# Patient Record
Sex: Female | Born: 1990 | Hispanic: Yes | Marital: Married | Smoking: Never smoker
Health system: Southern US, Community
[De-identification: ages and names within clinical notes are randomized; demographics above are authoritative.]

## PROBLEM LIST (undated history)

## (undated) DIAGNOSIS — K429 Umbilical hernia without obstruction or gangrene: Secondary | ICD-10-CM

---

## 2013-07-28 ENCOUNTER — Emergency Department (HOSPITAL_BASED_OUTPATIENT_CLINIC_OR_DEPARTMENT_OTHER)
Admission: EM | Admit: 2013-07-28 | Discharge: 2013-07-28 | Disposition: A | Discharge status: Home / Self Care | Payer: Self-pay | Attending: Emergency Medicine | Admitting: Emergency Medicine

## 2013-07-28 ENCOUNTER — Encounter (HOSPITAL_BASED_OUTPATIENT_CLINIC_OR_DEPARTMENT_OTHER): Payer: Self-pay | Admitting: Emergency Medicine

## 2013-07-28 DIAGNOSIS — Z3202 Encounter for pregnancy test, result negative: Secondary | ICD-10-CM | POA: Insufficient documentation

## 2013-07-28 DIAGNOSIS — Z79899 Other long term (current) drug therapy: Secondary | ICD-10-CM | POA: Insufficient documentation

## 2013-07-28 DIAGNOSIS — N390 Urinary tract infection, site not specified: Secondary | ICD-10-CM | POA: Insufficient documentation

## 2013-07-28 LAB — URINALYSIS, ROUTINE W REFLEX MICROSCOPIC
Bilirubin Urine: NEGATIVE
Glucose, UA: NEGATIVE mg/dL
Hgb urine dipstick: NEGATIVE
Ketones, ur: NEGATIVE mg/dL
Nitrite: NEGATIVE
Protein, ur: NEGATIVE mg/dL
Specific Gravity, Urine: 1.021 (ref 1.005–1.030)
Urobilinogen, UA: 0.2 mg/dL (ref 0.0–1.0)
pH: 7 (ref 5.0–8.0)

## 2013-07-28 LAB — URINE MICROSCOPIC-ADD ON

## 2013-07-28 LAB — PREGNANCY, URINE: Preg Test, Ur: NEGATIVE

## 2013-07-28 MED ORDER — PHENAZOPYRIDINE HCL 200 MG PO TABS: 200.0000 mg | ORAL_TABLET | Freq: Three times a day (TID) | ORAL | Status: DC

## 2013-07-28 MED ORDER — NITROFURANTOIN MONOHYD MACRO 100 MG PO CAPS: 100.0000 mg | ORAL_CAPSULE | Freq: Two times a day (BID) | ORAL | Status: DC

## 2013-07-28 MED ORDER — NITROFURANTOIN MONOHYD MACRO 100 MG PO CAPS
100.0000 mg | ORAL_CAPSULE | Freq: Once | ORAL | Status: AC
  Administered 2013-07-28: 100 mg via ORAL
  Filled 2013-07-28: qty 1

## 2013-07-28 NOTE — ED Provider Notes (Signed)
CSN: [id number]     Arrival date & time 07/28/13  0134 History   None    Chief Complaint  Patient presents with  . Dysuria     (Consider location/radiation/quality/duration/timing/severity/associated sxs/prior Treatment) Patient is a 23 y.o. female presenting with dysuria. The history is provided by the patient. No language interpreter was used.  Dysuria Pain quality:  Aching Pain severity:  Moderate Onset quality:  Gradual Duration:  5 days Timing:  Constant Progression:  Unchanged Chronicity:  Recurrent Recent urinary tract infections: yes   Relieved by:  Nothing Worsened by:  Nothing tried Ineffective treatments:  None tried Urinary symptoms: no discolored urine and no hematuria   Associated symptoms: no abdominal pain and no flank pain   Risk factors: no hx of pyelonephritis     History reviewed. No pertinent past medical history. History reviewed. No pertinent past surgical history. History reviewed. No pertinent family history. History  Substance Use Topics  . Smoking status: Never Smoker   . Smokeless tobacco: Not on file  . Alcohol Use: Not on file   OB History   Grav Para Term Preterm Abortions TAB SAB Ect Mult Living                 Review of Systems  Gastrointestinal: Negative for abdominal pain.  Genitourinary: Positive for dysuria. Negative for flank pain.  All other systems reviewed and are negative.     Allergies  Review of patient's allergies indicates no known allergies.  Home Medications   Current Outpatient Rx  Name  Route  Sig  Dispense  Refill  . nitrofurantoin, macrocrystal-monohydrate, (MACROBID) 100 MG capsule   Oral   Take 1 capsule (100 mg total) by mouth 2 (two) times daily. X 7 days   14 capsule   0   . phenazopyridine (PYRIDIUM) 200 MG tablet   Oral   Take 1 tablet (200 mg total) by mouth 3 (three) times daily.   6 tablet   0    BP 128/83  Pulse 88  Temp(Src) 98.2 F (36.8 C) (Oral)  Resp 18  Ht 5' (1.524 m)  Wt  155 lb (70.308 kg)  BMI 30.27 kg/m2  SpO2 99%  LMP 07/19/2013 Physical Exam  Constitutional: She is oriented to person, place, and time. She appears well-developed and well-nourished. No distress.  HENT:  Head: Normocephalic and atraumatic.  Mouth/Throat: Oropharynx is clear and moist.  Eyes: Conjunctivae are normal. Pupils are equal, round, and reactive to light.  Neck: Normal range of motion. Neck supple.  Cardiovascular: Normal rate, regular rhythm and intact distal pulses.   Pulmonary/Chest: Effort normal and breath sounds normal. She has no wheezes. She has no rales.  Abdominal: Soft. Bowel sounds are normal. There is no tenderness. There is no rebound and no guarding.  Musculoskeletal: Normal range of motion.  Neurological: She is alert and oriented to person, place, and time.  Skin: Skin is warm and dry.  Psychiatric: She has a normal mood and affect.    ED Course  Procedures (including critical care time) Labs Review Labs Reviewed  URINALYSIS, ROUTINE W REFLEX MICROSCOPIC - Abnormal; Notable for the following:    APPearance CLOUDY (*)    Leukocytes, UA MODERATE (*)    All other components within normal limits  URINE MICROSCOPIC-ADD ON - Abnormal; Notable for the following:    Squamous Epithelial / LPF MANY (*)    Bacteria, UA FEW (*)    All other components within normal limits  PREGNANCY, URINE  Imaging Review No results found.   EKG Interpretation None      MDM   Final diagnoses:  UTI (lower urinary tract infection)   UTI will treat with macrobid and pyridium, follow up with your PMD in 7 days for a recheck    Tyshell Ramberg K Lowana Hable-Rasch, MD 07/28/13 [phone]

## 2013-07-28 NOTE — ED Notes (Signed)
Pt reports burining with urination x 1 week

## 2016-10-26 ENCOUNTER — Encounter (HOSPITAL_BASED_OUTPATIENT_CLINIC_OR_DEPARTMENT_OTHER): Payer: Self-pay | Admitting: *Deleted

## 2016-10-26 ENCOUNTER — Emergency Department (HOSPITAL_BASED_OUTPATIENT_CLINIC_OR_DEPARTMENT_OTHER)
Admission: EM | Admit: 2016-10-26 | Discharge: 2016-10-26 | Disposition: A | Discharge status: Home / Self Care | Payer: Self-pay | Attending: Emergency Medicine | Admitting: Emergency Medicine

## 2016-10-26 ENCOUNTER — Emergency Department (HOSPITAL_BASED_OUTPATIENT_CLINIC_OR_DEPARTMENT_OTHER): Payer: Self-pay

## 2016-10-26 DIAGNOSIS — R0789 Other chest pain: Secondary | ICD-10-CM

## 2016-10-26 DIAGNOSIS — R079 Chest pain, unspecified: Secondary | ICD-10-CM | POA: Insufficient documentation

## 2016-10-26 LAB — PREGNANCY, URINE: Preg Test, Ur: NEGATIVE

## 2016-10-26 MED ORDER — IBUPROFEN 800 MG PO TABS
800.0000 mg | ORAL_TABLET | Freq: Once | ORAL | Status: AC
  Administered 2016-10-26: 800 mg via ORAL
  Filled 2016-10-26: qty 1

## 2016-10-26 MED ORDER — IBUPROFEN 800 MG PO TABS: 800.0000 mg | ORAL_TABLET | Freq: Three times a day (TID) | ORAL | 0 refills | Status: DC | PRN

## 2016-10-26 NOTE — Discharge Instructions (Signed)
You may alternate Tylenol 1000 mg every 6 hours as needed for pain and Ibuprofen 800 mg every 8 hours as needed for pain.  Please take Ibuprofen with food. ° ° ° °To find a primary care or specialty doctor please call [phone] or [phone] to access "[facility] Find a Doctor Service." ° °You may also go on the [facility] website at [email or web address] ° °There are also multiple [facility], [facility], [facility] and [facility] practices throughout the [place] that are frequently accepting new patients. You may find a clinic that is close to your home and contact them. ° °[facility] -  °[address] °[place] [address] °[phone] ° ° °[facility] -  °[address] °[place] [address] [phone] ° ° °[facility] - °[address]  °[place] [address] °[phone] ° ° °

## 2016-10-26 NOTE — ED Triage Notes (Addendum)
Pt c/o left sided chest pain and SOB and h/a   x 2 weeks off and on

## 2016-10-26 NOTE — ED Notes (Signed)
ED Provider at bedside. 

## 2016-10-26 NOTE — ED Notes (Signed)
Patient transported to X-ray 

## 2016-10-26 NOTE — ED Provider Notes (Signed)
TIME SEEN: 2:15 AM  CHIEF COMPLAINT: Chest pain  HPI: Patient is a 26 year old female with no significant past medical history who presents to the emergency department with 2 weeks of intermittent left chest pain. She describes the pain as sharp in nature and only last for a split second and then resolves. No aggravating or alleviating factors. No radiation of pain. She states sometimes it makes her feel like she can't catch her breath. No nausea, vomiting, fevers, cough, lower extremity swelling or pain. She denies history of PE, DVT. No recent prolonged immobilization such as long flight, hospitalization, fracture, surgery, trauma. She does not smoke. She is on birth control pills. No family history of sudden cardiac death or premature CAD. She has not tried any medications prior to arrival. No chest pain or shortness of breath currently.  ROS: See HPI Constitutional: no fever  Eyes: no drainage  ENT: no runny nose   Cardiovascular:   chest pain  Resp:  SOB  GI: no vomiting GU: no dysuria Integumentary: no rash  Allergy: no hives  Musculoskeletal: no leg swelling  Neurological: no slurred speech ROS otherwise negative  PAST MEDICAL HISTORY/PAST SURGICAL HISTORY:  History reviewed. No pertinent past medical history.  MEDICATIONS:  Prior to Admission medications   Medication Sig Start Date End Date Taking? Authorizing Provider  nitrofurantoin, macrocrystal-monohydrate, (MACROBID) 100 MG capsule Take 1 capsule (100 mg total) by mouth 2 (two) times daily. X 7 days 07/28/13   Palumbo, April, MD  phenazopyridine (PYRIDIUM) 200 MG tablet Take 1 tablet (200 mg total) by mouth 3 (three) times daily. 07/28/13   Palumbo, April, MD    ALLERGIES:  No Known Allergies  SOCIAL HISTORY:  Social History  Substance Use Topics  . Smoking status: Never Smoker  . Smokeless tobacco: Not on file  . Alcohol use No    FAMILY HISTORY: No family history on file.  EXAM: BP 137/85   Pulse 72   Temp  98.2 F (36.8 C)   Resp 18   Ht 5\' 1"  (1.549 m)   Wt 77.1 kg (170 lb)   LMP 10/25/2016   SpO2 100%   BMI 32.12 kg/m  CONSTITUTIONAL: Alert and oriented and responds appropriately to questions. Well-appearing; well-nourished HEAD: Normocephalic EYES: Conjunctivae clear, pupils appear equal, EOMI ENT: normal nose; moist mucous membranes NECK: Supple, no meningismus, no nuchal rigidity, no LAD  CARD: RRR; S1 and S2 appreciated; no murmurs, no clicks, no rubs, no gallops CHEST:  Chest wall is tender to palpation over the left anterior chest wall which reproduces her pain.  No crepitus, ecchymosis, erythema, warmth, rash or other lesions present.   RESP: Normal chest excursion without splinting or tachypnea; breath sounds clear and equal bilaterally; no wheezes, no rhonchi, no rales, no hypoxia or respiratory distress, speaking full sentences ABD/GI: Normal bowel sounds; non-distended; soft, non-tender, no rebound, no guarding, no peritoneal signs, no hepatosplenomegaly BACK:  The back appears normal and is non-tender to palpation, there is no CVA tenderness EXT: Normal ROM in all joints; non-tender to palpation; no edema; normal capillary refill; no cyanosis, no calf tenderness or swelling    SKIN: Normal color for age and race; warm; no rash NEURO: Moves all extremities equally PSYCH: The patient's mood and manner are appropriate. Grooming and personal hygiene are appropriate.  MEDICAL DECISION MAKING: Patient here with very atypical chest pain. It is reproducible with palpation and only last for 1-2 seconds and then resolves. She is not having any symptoms currently. EKG  shows no ischemic abnormality. Chest x-ray is also clear. No edema, pneumonia, pneumothorax, rib fracture. Pregnancy test negative. I do not think this is a pulmonary embolus. She has no significant risk factors and is not tachycardic, hypotensive or hypoxic. I do not think this is ACS. I do not think this is a dissection. I  feel she is safe for discharge home and follow-up with an outpatient provider as needed. Recommended alternating Tylenol and Motrin. Given dose of ibuprofen prior to discharge. Patient comfortable with this plan.  At this time, I do not feel there is any life-threatening condition present. I have reviewed and discussed all results (EKG, imaging, lab, urine as appropriate) and exam findings with patient/family. I have reviewed nursing notes and appropriate previous records.  I feel the patient is safe to be discharged home without further emergent workup and can continue workup as an outpatient as needed. Discussed usual and customary return precautions. Patient/family verbalize understanding and are comfortable with this plan.  Outpatient follow-up has been provided if needed. All questions have been answered.      EKG Interpretation  Date/Time:  Tuesday October 26 2016 01:14:51 EDT Ventricular Rate:  77 PR Interval:    QRS Duration: 100 QT Interval:  391 QTC Calculation: 443 R Axis:   71 Text Interpretation:  Sinus rhythm Borderline Q waves in inferior leads No significant change was found Confirmed by Rochele RaringWard, Danay Mckellar [phone]([id number]) on 10/26/2016 1:17:58 AM         Marua Qin, Layla MawKristen N, DO 10/26/16 0340

## 2018-03-30 ENCOUNTER — Emergency Department (HOSPITAL_BASED_OUTPATIENT_CLINIC_OR_DEPARTMENT_OTHER)
Admission: EM | Admit: 2018-03-30 | Discharge: 2018-03-31 | Disposition: A | Discharge status: Home / Self Care | Payer: Self-pay | Attending: Emergency Medicine | Admitting: Emergency Medicine

## 2018-03-30 ENCOUNTER — Encounter (HOSPITAL_BASED_OUTPATIENT_CLINIC_OR_DEPARTMENT_OTHER): Payer: Self-pay | Admitting: *Deleted

## 2018-03-30 ENCOUNTER — Other Ambulatory Visit: Payer: Self-pay

## 2018-03-30 DIAGNOSIS — R0789 Other chest pain: Secondary | ICD-10-CM

## 2018-03-30 DIAGNOSIS — R002 Palpitations: Secondary | ICD-10-CM

## 2018-03-30 NOTE — ED Triage Notes (Signed)
Chest pain x 2 days. Throbbing pain in the center of her chest. States sometimes she feels something jump in her chest causing her to be loose her breath for a second.

## 2018-03-30 NOTE — ED Provider Notes (Signed)
[facility] Provider Note: Jennifer DellJ. Lane Maisa Bedingfield, MD, FACEP  CSN: [id number] MRN: [id number] ARRIVAL: 03/30/18 at 2120 ROOM: MH01/[facility]01   CHIEF COMPLAINT  Chest Pain   HISTORY OF PRESENT ILLNESS  03/30/18 11:39 PM Jennifer Gallagher is a 27 y.o. female with no significant past medical history.  She has been having chest pain since this afternoon's (nursing notes indicate 2 days but the patient insists to me that it began this afternoon).  The pain is located to the left of her upper sternum.  It is dull and moderate in severity.  It happens for 1 or 2 minutes at a time.  Nothing brings the symptoms on.  She feels short of breath during these episodes but not nauseated or diaphoretic.  Pain is not worse with movement or palpation or deep breathing.  She is also been having palpitations which she describes as "spasms" that last 1 to 2 seconds and seem to take her breath away.  She has had these before.    History reviewed. No pertinent past medical history.  History reviewed. No pertinent surgical history.  No family history on file.  Social History   Tobacco Use  . Smoking status: Never Smoker  . Smokeless tobacco: Never Used  Substance Use Topics  . Alcohol use: No  . Drug use: No    Prior to Admission medications   Medication Sig Start Date End Date Taking? Authorizing Provider  ibuprofen (ADVIL,MOTRIN) 800 MG tablet Take 1 tablet (800 mg total) by mouth every 8 (eight) hours as needed for mild pain. 10/26/16   Ward, Layla MawKristen N, DO  nitrofurantoin, macrocrystal-monohydrate, (MACROBID) 100 MG capsule Take 1 capsule (100 mg total) by mouth 2 (two) times daily. X 7 days 07/28/13   Palumbo, April, MD  phenazopyridine (PYRIDIUM) 200 MG tablet Take 1 tablet (200 mg total) by mouth 3 (three) times daily. 07/28/13   Palumbo, April, MD    Allergies Patient has no known allergies.   REVIEW OF SYSTEMS  Negative except as noted here or in the History of Present  Illness.   PHYSICAL EXAMINATION  Initial Vital Signs Blood pressure 128/83, pulse 66, temperature 98.8 F (37.1 C), temperature source Oral, resp. rate 18, height 5\' 1"  (1.549 m), weight 74.8 kg, SpO2 99 %.  Examination General: Well-developed, well-nourished female in no acute distress; appearance consistent with age of record HENT: normocephalic; atraumatic Eyes: pupils equal, round and reactive to light; extraocular muscles intact Neck: supple Heart: regular rate and rhythm; no murmur Lungs: clear to auscultation bilaterally Chest: Nontender Abdomen: soft; nondistended; nontender; bowel sounds present Extremities: No deformity; full range of motion; pulses normal Neurologic: Awake, alert and oriented; motor function intact in all extremities and symmetric; no facial droop Skin: Warm and dry Psychiatric: Normal mood and affect   RESULTS  Summary of this visit's results, reviewed by myself:   EKG Interpretation  Date/Time:  Thursday March 30 2018 21:26:22 EST Ventricular Rate:  76 PR Interval:  148 QRS Duration: 94 QT Interval:  404 QTC Calculation: 454 R Axis:   72 Text Interpretation:  Normal sinus rhythm Normal ECG No significant change since last tracing Confirmed by Frederick PeersLittle, Rachel [phone]([id number]) on 03/30/2018 9:44:22 PM      Laboratory Studies: Results for orders placed or performed during the hospital encounter of 03/30/18 (from the past 24 hour(s))  CBC with Differential/Platelet     Status: None   Collection Time: 03/31/18 12:10 AM  Result Value Ref Range   WBC 6.9 4.0 -  10.5 K/uL   RBC 4.18 3.87 - 5.11 MIL/uL   Hemoglobin 12.6 12.0 - 15.0 g/dL   HCT 16.1 09.6 - 04.5 %   MCV 92.6 80.0 - 100.0 fL   MCH 30.1 26.0 - 34.0 pg   MCHC 32.6 30.0 - 36.0 g/dL   RDW 40.9 81.1 - 91.4 %   Platelets 284 150 - 400 K/uL   nRBC 0.0 0.0 - 0.2 %   Neutrophils Relative % 58 %   Neutro Abs 4.0 1.7 - 7.7 K/uL   Lymphocytes Relative 32 %   Lymphs Abs 2.2 0.7 - 4.0 K/uL    Monocytes Relative 8 %   Monocytes Absolute 0.6 0.1 - 1.0 K/uL   Eosinophils Relative 2 %   Eosinophils Absolute 0.1 0.0 - 0.5 K/uL   Basophils Relative 0 %   Basophils Absolute 0.0 0.0 - 0.1 K/uL   Immature Granulocytes 0 %   Abs Immature Granulocytes 0.01 0.00 - 0.07 K/uL  Basic metabolic panel     Status: Abnormal   Collection Time: 03/31/18 12:10 AM  Result Value Ref Range   Sodium 139 135 - 145 mmol/L   Potassium 3.2 (L) 3.5 - 5.1 mmol/L   Chloride 107 98 - 111 mmol/L   CO2 24 22 - 32 mmol/L   Glucose, Bld 98 70 - 99 mg/dL   BUN 7 6 - 20 mg/dL   Creatinine, Ser 7.82 0.44 - 1.00 mg/dL   Calcium 8.7 (L) 8.9 - 10.3 mg/dL   GFR calc non Af Amer >60 >60 mL/min   GFR calc Af Amer >60 >60 mL/min   Anion gap 8 5 - 15  Troponin I - ONCE - STAT     Status: None   Collection Time: 03/31/18 12:10 AM  Result Value Ref Range   Troponin I <0.03 <0.03 ng/mL  Pregnancy, urine     Status: None   Collection Time: 03/31/18 12:16 AM  Result Value Ref Range   Preg Test, Ur NEGATIVE NEGATIVE   Imaging Studies: No results found.  ED COURSE and MDM  Nursing notes and initial vitals signs, including pulse oximetry, reviewed.  Vitals:   03/30/18 2128 03/30/18 2322 03/31/18 0000 03/31/18 0100  BP: 128/83 116/73 105/73 115/74  Pulse: 66 76 75 74  Resp: 18 17 15 19   Temp: 98.8 F (37.1 C)     TempSrc: Oral     SpO2: 99% 99% 100% 100%  Weight:      Height:       1:28 AM Patient feeling better.  She has had no "spasms" while in the ED.  Her monitor strip has been free of PVCs or other arrhythmia.  I suspect these "spasms" represent PVCs given the way she describes them.  She has a primary care physician with whom she can follow-up.  PROCEDURES    ED DIAGNOSES     ICD-10-CM   1. Atypical chest pain R07.89   2. Palpitations R00.2        Tressy Kunzman, Jonny Ruiz, MD 03/31/18 [phone]

## 2018-03-31 LAB — CBC WITH DIFFERENTIAL/PLATELET
ABS IMMATURE GRANULOCYTES: 0.01 10*3/uL (ref 0.00–0.07)
BASOS PCT: 0 %
Basophils Absolute: 0 10*3/uL (ref 0.0–0.1)
Eosinophils Absolute: 0.1 10*3/uL (ref 0.0–0.5)
Eosinophils Relative: 2 %
HEMATOCRIT: 38.7 % (ref 36.0–46.0)
HEMOGLOBIN: 12.6 g/dL (ref 12.0–15.0)
IMMATURE GRANULOCYTES: 0 %
Lymphocytes Relative: 32 %
Lymphs Abs: 2.2 10*3/uL (ref 0.7–4.0)
MCH: 30.1 pg (ref 26.0–34.0)
MCHC: 32.6 g/dL (ref 30.0–36.0)
MCV: 92.6 fL (ref 80.0–100.0)
MONO ABS: 0.6 10*3/uL (ref 0.1–1.0)
MONOS PCT: 8 %
NEUTROS ABS: 4 10*3/uL (ref 1.7–7.7)
NEUTROS PCT: 58 %
PLATELETS: 284 10*3/uL (ref 150–400)
RBC: 4.18 MIL/uL (ref 3.87–5.11)
RDW: 12.5 % (ref 11.5–15.5)
WBC: 6.9 10*3/uL (ref 4.0–10.5)
nRBC: 0 % (ref 0.0–0.2)

## 2018-03-31 LAB — BASIC METABOLIC PANEL
ANION GAP: 8 (ref 5–15)
BUN: 7 mg/dL (ref 6–20)
CHLORIDE: 107 mmol/L (ref 98–111)
CO2: 24 mmol/L (ref 22–32)
Calcium: 8.7 mg/dL — ABNORMAL LOW (ref 8.9–10.3)
Creatinine, Ser: 0.53 mg/dL (ref 0.44–1.00)
GFR calc Af Amer: >60 mL/min (ref >60)
GFR calc non Af Amer: >60 mL/min (ref >60)
GLUCOSE: 98 mg/dL (ref 70–99)
Potassium: 3.2 mmol/L — ABNORMAL LOW (ref 3.5–5.1)
Sodium: 139 mmol/L (ref 135–145)

## 2018-03-31 LAB — PREGNANCY, URINE: Preg Test, Ur: NEGATIVE

## 2018-03-31 LAB — TROPONIN I: Troponin I: <0.03 ng/mL (ref <0.03)

## 2018-06-30 ENCOUNTER — Emergency Department (HOSPITAL_BASED_OUTPATIENT_CLINIC_OR_DEPARTMENT_OTHER): Payer: Self-pay

## 2018-06-30 ENCOUNTER — Emergency Department (HOSPITAL_BASED_OUTPATIENT_CLINIC_OR_DEPARTMENT_OTHER)
Admission: EM | Admit: 2018-06-30 | Discharge: 2018-06-30 | Disposition: A | Discharge status: Home / Self Care | Payer: Self-pay | Attending: Emergency Medicine | Admitting: Emergency Medicine

## 2018-06-30 ENCOUNTER — Encounter (HOSPITAL_BASED_OUTPATIENT_CLINIC_OR_DEPARTMENT_OTHER): Payer: Self-pay | Admitting: Adult Health

## 2018-06-30 ENCOUNTER — Other Ambulatory Visit: Payer: Self-pay

## 2018-06-30 DIAGNOSIS — W010XXA Fall on same level from slipping, tripping and stumbling without subsequent striking against object, initial encounter: Secondary | ICD-10-CM | POA: Insufficient documentation

## 2018-06-30 DIAGNOSIS — Y92019 Unspecified place in single-family (private) house as the place of occurrence of the external cause: Secondary | ICD-10-CM | POA: Insufficient documentation

## 2018-06-30 DIAGNOSIS — S5000XA Contusion of unspecified elbow, initial encounter: Secondary | ICD-10-CM

## 2018-06-30 DIAGNOSIS — Y998 Other external cause status: Secondary | ICD-10-CM | POA: Insufficient documentation

## 2018-06-30 DIAGNOSIS — Y93E5 Activity, floor mopping and cleaning: Secondary | ICD-10-CM | POA: Insufficient documentation

## 2018-06-30 DIAGNOSIS — S5001XA Contusion of right elbow, initial encounter: Secondary | ICD-10-CM | POA: Insufficient documentation

## 2018-06-30 MED ORDER — ONDANSETRON HCL 4 MG/2ML IJ SOLN
4.0000 mg | Freq: Once | INTRAMUSCULAR | Status: AC
  Administered 2018-06-30: 4 mg via INTRAVENOUS
  Filled 2018-06-30: qty 2

## 2018-06-30 MED ORDER — MORPHINE SULFATE (PF) 4 MG/ML IV SOLN
4.0000 mg | Freq: Once | INTRAVENOUS | Status: AC
Start: 2018-06-30 — End: 2018-06-30
  Administered 2018-06-30: 4 mg via INTRAVENOUS
  Filled 2018-06-30: qty 1

## 2018-06-30 NOTE — Discharge Instructions (Signed)
Wear the sling as needed for comfort.  Do not do any heavy lifting with the right arm for the next few days until starting to feel better.  Take Tylenol and ibuprofen as needed for the pain.

## 2018-06-30 NOTE — ED Provider Notes (Signed)
[facility] EMERGENCY DEPARTMENT Provider Note   CSN: [id number] Arrival date & time: 06/30/18  8295    History   Chief Complaint Chief Complaint  Patient presents with  . Elbow Injury    HPI Jennifer Gallagher is a 28 y.o. female.     Patient is a 28 year old healthy female presenting today after falling and injuring her right elbow.  She states she was mopping the floor and she slipped and fell landing directly on her elbow.  Since that time she has not been able to straighten her arm and has 10 out of 10 sharp shooting pain in her right elbow.  She states her hand feels little funny but she can sense everything.  It is very painful for her to do hand grip and she states she is unable to straighten her arm.  She has no wrist pain or shoulder pain.  She did not hit her head or lose consciousness.  She denies any injury in her legs.  The history is provided by the patient.    History reviewed. No pertinent past medical history.  There are no active problems to display for this patient.   History reviewed. No pertinent surgical history.   OB History   No obstetric history on file.      Home Medications    Prior to Admission medications   Not on File    Family History History reviewed. No pertinent family history.  Social History Social History   Tobacco Use  . Smoking status: Never Smoker  . Smokeless tobacco: Never Used  Substance Use Topics  . Alcohol use: No  . Drug use: No     Allergies   Patient has no known allergies.   Review of Systems Review of Systems  All other systems reviewed and are negative.    Physical Exam Updated Vital Signs BP 114/82 (BP Location: Left Arm)   Pulse 74   Temp 98.2 F (36.8 C) (Oral)   Resp 18   Ht 5\' 1"  (1.549 m)   Wt 74.8 kg   SpO2 98%   BMI 31.18 kg/m   Physical Exam Vitals signs and nursing note reviewed.  Constitutional:      General: She is not in acute distress.    Appearance: She  is well-developed.  HENT:     Head: Normocephalic and atraumatic.  Eyes:     Pupils: Pupils are equal, round, and reactive to light.  Cardiovascular:     Rate and Rhythm: Normal rate and regular rhythm.     Heart sounds: Normal heart sounds. No murmur. No friction rub.  Pulmonary:     Effort: Pulmonary effort is normal.     Breath sounds: Normal breath sounds. No wheezing or rales.  Abdominal:     General: Bowel sounds are normal. There is no distension.     Palpations: Abdomen is soft.     Tenderness: There is no abdominal tenderness. There is no guarding or rebound.  Musculoskeletal:        General: Tenderness, deformity and signs of injury present.     Right shoulder: Normal.     Right elbow: She exhibits decreased range of motion, swelling and deformity. Tenderness found. Medial epicondyle, lateral epicondyle and olecranon process tenderness noted.     Right wrist: Normal.     Comments: Sensation to the right hand intact and hand grip 4/5.  2+ radial pulse in the right wrist  Skin:    General: Skin is warm  and dry.     Capillary Refill: Capillary refill takes less than 2 seconds.     Findings: No rash.  Neurological:     Mental Status: She is alert and oriented to person, place, and time.     Cranial Nerves: No cranial nerve deficit.  Psychiatric:        Behavior: Behavior normal.      ED Treatments / Results  Labs (all labs ordered are listed, but only abnormal results are displayed) Labs Reviewed - No data to display  EKG None  Radiology Dg Elbow Complete Right  Result Date: 06/30/2018 CLINICAL DATA:  Posterior right elbow pain and swelling due to an injury suffered in a fall today. Initial encounter. EXAM: RIGHT ELBOW - COMPLETE 3+ VIEW COMPARISON:  None. FINDINGS: Soft tissue swelling is seen over the olecranon most consistent with hemorrhage into the olecranon bursa given the patient's history. No fracture or dislocation. No elbow joint effusion. IMPRESSION:  Findings most consistent with hemorrhagic olecranon bursitis. No bony abnormality. Electronically Signed   By: Drusilla Kanner M.D.   On: 06/30/2018 11:10    Procedures Procedures (including critical care time)  Medications Ordered in ED Medications  ondansetron (ZOFRAN) injection 4 mg (4 mg Intravenous Given 06/30/18 1018)  morphine 4 MG/ML injection 4 mg (4 mg Intravenous Given 06/30/18 1018)     Initial Impression / Assessment and Plan / ED Course  I have reviewed the triage vital signs and the nursing notes.  Pertinent labs & imaging results that were available during my care of the patient were reviewed by me and considered in my medical decision making (see chart for details).       Healthy young female presenting today with elbow deformity and pain after falling.  Concern for fracture versus dislocation.  Neurovascularly intact.  Patient given pain medication and x-rays pending.  12:17 PM X-ray is negative for fracture or dislocation.  It does show a hemorrhagic olecranon bursitis which is from the fall and where she has the majority of her swelling and ecchymosis.  Patient was placed in a sling for comfort and will use ibuprofen and Tylenol as needed.  Given follow-up as needed.  Final Clinical Impressions(s) / ED Diagnoses   Final diagnoses:  Traumatic hematoma of elbow, initial encounter    ED Discharge Orders    None       Gwyneth Sprout, MD 06/30/18 1218

## 2018-06-30 NOTE — ED Notes (Signed)
Patient transported to X-ray 

## 2018-06-30 NOTE — ED Triage Notes (Signed)
Present with right elbow deformity after slippong and falling on water that was on the floor. CMS intact.

## 2018-06-30 NOTE — ED Notes (Signed)
Slipped on water after mopping floor  Pain and deformity to rt elbow   Pos radial pulse

## 2018-09-21 ENCOUNTER — Ambulatory Visit (HOSPITAL_BASED_OUTPATIENT_CLINIC_OR_DEPARTMENT_OTHER)
Admission: RE | Admit: 2018-09-21 | Discharge: 2018-09-21 | Disposition: A | Discharge status: Home / Self Care | Payer: Self-pay | Source: Ambulatory Visit | Attending: Emergency Medicine | Admitting: Emergency Medicine

## 2018-09-21 ENCOUNTER — Encounter (HOSPITAL_BASED_OUTPATIENT_CLINIC_OR_DEPARTMENT_OTHER): Payer: Self-pay

## 2018-09-21 ENCOUNTER — Emergency Department (HOSPITAL_BASED_OUTPATIENT_CLINIC_OR_DEPARTMENT_OTHER)
Admission: EM | Admit: 2018-09-21 | Discharge: 2018-09-21 | Disposition: A | Discharge status: Home / Self Care | Payer: Self-pay | Attending: Emergency Medicine | Admitting: Emergency Medicine

## 2018-09-21 ENCOUNTER — Other Ambulatory Visit: Payer: Self-pay

## 2018-09-21 ENCOUNTER — Encounter (HOSPITAL_BASED_OUTPATIENT_CLINIC_OR_DEPARTMENT_OTHER): Payer: Self-pay | Admitting: Emergency Medicine

## 2018-09-21 DIAGNOSIS — Y762 Prosthetic and other implants, materials and accessory obstetric and gynecological devices associated with adverse incidents: Secondary | ICD-10-CM | POA: Insufficient documentation

## 2018-09-21 DIAGNOSIS — Z975 Presence of (intrauterine) contraceptive device: Secondary | ICD-10-CM | POA: Insufficient documentation

## 2018-09-21 DIAGNOSIS — T839XXA Unspecified complication of genitourinary prosthetic device, implant and graft, initial encounter: Secondary | ICD-10-CM | POA: Insufficient documentation

## 2018-09-21 LAB — PREGNANCY, URINE: Preg Test, Ur: NEGATIVE

## 2018-09-21 LAB — WET PREP, GENITAL
Clue Cells Wet Prep HPF POC: NONE SEEN
Sperm: NONE SEEN
Trich, Wet Prep: NONE SEEN
Yeast Wet Prep HPF POC: NONE SEEN

## 2018-09-21 MED ORDER — KETOROLAC TROMETHAMINE 30 MG/ML IJ SOLN
30.0000 mg | Freq: Once | INTRAMUSCULAR | Status: AC
  Administered 2018-09-21: 30 mg via INTRAMUSCULAR
  Filled 2018-09-21: qty 1

## 2018-09-21 NOTE — ED Provider Notes (Signed)
[facility] Provider Note: Lowella Dell, MD, FACEP  CSN: [id number] MRN: [id number] ARRIVAL: 09/21/18 at 0111 ROOM: [facility]03/[facility]03   CHIEF COMPLAINT  Vaginal Bleeding   HISTORY OF PRESENT ILLNESS  09/21/18 1:29 AM Starlisha Hafen is a 28 y.o. female G3P0 who had her Mirena IUD changed 5 days ago.  Since that time she has had vaginal bleeding and lower abdominal cramping.  She passed a mass of tissue-like material yesterday.  Her cramping persists and she rates it as a 7 out of 10.  Her bleeding has ceased.  She has not taken anything for her symptoms.   History reviewed. No pertinent past medical history.  History reviewed. No pertinent surgical history.  No family history on file.  Social History   Tobacco Use  . Smoking status: Never Smoker  . Smokeless tobacco: Never Used  Substance Use Topics  . Alcohol use: No  . Drug use: No    Prior to Admission medications   Not on File    Allergies Patient has no known allergies.   REVIEW OF SYSTEMS  Negative except as noted here or in the History of Present Illness.   PHYSICAL EXAMINATION  Initial Vital Signs Blood pressure 110/68, pulse 84, temperature 99.5 F (37.5 C), temperature source Oral, resp. rate 16, height 5\' 1"  (1.549 m), weight 75 kg, SpO2 98 %.  Examination General: Well-developed, well-nourished female in no acute distress; appearance consistent with age of record HENT: normocephalic; atraumatic Eyes: Normal appearance Neck: supple Heart: regular rate and rhythm Lungs: clear to auscultation bilaterally Abdomen: soft; nondistended; mild suprapubic tenderness; bowel sounds present GU: Normal external genitalia; no vaginal bleeding; mucoid discharge per cervical os with IUD strings present; cervical motion tenderness; uterine tenderness Extremities: No deformity; full range of motion; pulses normal Neurologic: Awake, alert and oriented; motor function intact in all extremities and  symmetric; no facial droop Skin: Warm and dry Psychiatric: Normal mood and affect   RESULTS  Summary of this visit's results, reviewed by myself:   EKG Interpretation  Date/Time:    Ventricular Rate:    PR Interval:    QRS Duration:   QT Interval:    QTC Calculation:   R Axis:     Text Interpretation:        Laboratory Studies: Results for orders placed or performed during the hospital encounter of 09/21/18 (from the past 24 hour(s))  Pregnancy, urine     Status: None   Collection Time: 09/21/18  1:15 AM  Result Value Ref Range   Preg Test, Ur NEGATIVE NEGATIVE  Wet prep, genital     Status: Abnormal   Collection Time: 09/21/18  1:32 AM  Result Value Ref Range   Yeast Wet Prep HPF POC NONE SEEN NONE SEEN   Trich, Wet Prep NONE SEEN NONE SEEN   Clue Cells Wet Prep HPF POC NONE SEEN NONE SEEN   WBC, Wet Prep HPF POC MANY (A) NONE SEEN   Sperm NONE SEEN    Imaging Studies: No results found.  ED COURSE and MDM  Nursing notes and initial vitals signs, including pulse oximetry, reviewed.  Vitals:   09/21/18 0123 09/21/18 0126  BP:  110/68  Pulse:  84  Resp:  16  Temp:  99.5 F (37.5 C)  TempSrc:  Oral  SpO2:  98%  Weight: 75 kg   Height: 5\' 1"  (1.549 m)    2:02 AM Significant relief after IM Toradol.  She was advised she may take ibuprofen  or Aleve as needed should pain return.  We will arrange to have a pelvic ultrasound later this morning to verify IUD placement.  PROCEDURES    ED DIAGNOSES     ICD-10-CM   1. Complication of intrauterine device (IUD), initial encounter (HCC) T83.9XXA        Aubery Date, Jonny RuizJohn, MD 09/21/18 [phone]

## 2018-09-21 NOTE — ED Triage Notes (Signed)
Patient states she had an IUD removed 5 days ago; states had a negative pregnancy test then and had a new IUD placed on the same day; states she has been having lower abd cramping and vaginal bleeding since; states she passed a tissue like substance today. States currently cramping and denies bleeding.

## 2018-09-22 ENCOUNTER — Telehealth (HOSPITAL_BASED_OUTPATIENT_CLINIC_OR_DEPARTMENT_OTHER): Payer: Self-pay | Admitting: Emergency Medicine

## 2018-09-22 LAB — GC/CHLAMYDIA PROBE AMP (~~LOC~~) NOT AT ARMC
Chlamydia: NEGATIVE
Neisseria Gonorrhea: NEGATIVE

## 2018-09-22 NOTE — Telephone Encounter (Signed)
When pt returned to ED for [place], she brought in tissue sample that she had passed previously. It was placed in formaldehyde and left in lab. Consulted with Dr. Read Drivers who initially saw pt. Dr. Read Drivers states that after review of [place] and negative pregnancy result, pathology is not indicated.

## 2019-05-11 ENCOUNTER — Other Ambulatory Visit: Payer: Self-pay

## 2019-05-11 ENCOUNTER — Encounter (HOSPITAL_BASED_OUTPATIENT_CLINIC_OR_DEPARTMENT_OTHER): Payer: Self-pay

## 2019-05-11 DIAGNOSIS — K429 Umbilical hernia without obstruction or gangrene: Secondary | ICD-10-CM | POA: Insufficient documentation

## 2019-05-11 NOTE — ED Triage Notes (Signed)
Pt has an umbilical hernia that has been causing her pain for the last 24 hours. Pt states it is hard and swollen. Pt states she can usually push it back in, but is unable to so. Pt states she has burping and it taste like stool.

## 2019-05-12 ENCOUNTER — Emergency Department (HOSPITAL_BASED_OUTPATIENT_CLINIC_OR_DEPARTMENT_OTHER)
Admission: EM | Admit: 2019-05-12 | Discharge: 2019-05-12 | Disposition: A | Discharge status: Home / Self Care | Payer: Self-pay | Attending: Emergency Medicine | Admitting: Emergency Medicine

## 2019-05-12 ENCOUNTER — Emergency Department (HOSPITAL_BASED_OUTPATIENT_CLINIC_OR_DEPARTMENT_OTHER): Payer: Self-pay

## 2019-05-12 DIAGNOSIS — K429 Umbilical hernia without obstruction or gangrene: Secondary | ICD-10-CM

## 2019-05-12 HISTORY — DX: Umbilical hernia without obstruction or gangrene: K42.9

## 2019-05-12 LAB — CBC WITH DIFFERENTIAL/PLATELET
Abs Immature Granulocytes: 0.03 10*3/uL (ref 0.00–0.07)
Basophils Absolute: 0.1 10*3/uL (ref 0.0–0.1)
Basophils Relative: 1 %
Eosinophils Absolute: 0.2 10*3/uL (ref 0.0–0.5)
Eosinophils Relative: 2 %
HCT: 37.4 % (ref 36.0–46.0)
Hemoglobin: 12.4 g/dL (ref 12.0–15.0)
Immature Granulocytes: 0 %
Lymphocytes Relative: 40 %
Lymphs Abs: 4.2 10*3/uL — ABNORMAL HIGH (ref 0.7–4.0)
MCH: 31.6 pg (ref 26.0–34.0)
MCHC: 33.2 g/dL (ref 30.0–36.0)
MCV: 95.4 fL (ref 80.0–100.0)
Monocytes Absolute: 0.8 10*3/uL (ref 0.1–1.0)
Monocytes Relative: 7 %
Neutro Abs: 5.3 10*3/uL (ref 1.7–7.7)
Neutrophils Relative %: 50 %
Platelets: 282 10*3/uL (ref 150–400)
RBC: 3.92 MIL/uL (ref 3.87–5.11)
RDW: 13.1 % (ref 11.5–15.5)
WBC: 10.5 10*3/uL (ref 4.0–10.5)
nRBC: 0 % (ref 0.0–0.2)

## 2019-05-12 LAB — BASIC METABOLIC PANEL
Anion gap: 5 (ref 5–15)
BUN: 12 mg/dL (ref 6–20)
CO2: 24 mmol/L (ref 22–32)
Calcium: 9 mg/dL (ref 8.9–10.3)
Chloride: 108 mmol/L (ref 98–111)
Creatinine, Ser: 0.5 mg/dL (ref 0.44–1.00)
GFR calc Af Amer: >60 mL/min (ref >60)
GFR calc non Af Amer: >60 mL/min (ref >60)
Glucose, Bld: 91 mg/dL (ref 70–99)
Potassium: 3.7 mmol/L (ref 3.5–5.1)
Sodium: 137 mmol/L (ref 135–145)

## 2019-05-12 LAB — HCG, SERUM, QUALITATIVE: Preg, Serum: NEGATIVE

## 2019-05-12 MED ORDER — IOHEXOL 300 MG/ML  SOLN
100.0000 mL | Freq: Once | INTRAMUSCULAR | Status: AC | PRN
  Administered 2019-05-12: 100 mL via INTRAVENOUS

## 2019-05-12 MED ORDER — SODIUM CHLORIDE 0.9 % IV BOLUS
1000.0000 mL | Freq: Once | INTRAVENOUS | Status: AC
  Administered 2019-05-12: 02:00:00 1000 mL via INTRAVENOUS

## 2019-05-12 MED ORDER — HYDROCODONE-ACETAMINOPHEN 5-325 MG PO TABS: 1.0000 | ORAL_TABLET | Freq: Four times a day (QID) | ORAL | 0 refills | Status: AC | PRN

## 2019-05-12 NOTE — ED Provider Notes (Signed)
[facility] EMERGENCY DEPARTMENT Provider Note   CSN: [id number] Arrival date & time: 05/11/19  2326     History Chief Complaint  Patient presents with  . Abdominal Pain    Jennifer Gallagher is a 29 y.o. female.  Patient is a 29 year old female with history of umbilical hernia that has been present for the past year.  She states that she could normally push it back in, however for the past 24 hours it has been painful and nonreducible.  She denies any fevers or chills.  She does report increased belching but denies any vomiting.  She had a bowel movement earlier this morning which was normal.  The history is provided by the patient.  Abdominal Pain Pain location:  Periumbilical Pain radiates to:  Does not radiate Pain severity:  Moderate Onset quality:  Sudden Duration:  24 hours Timing:  Constant Progression:  Worsening Chronicity:  New      Past Medical History:  Diagnosis Date  . Umbilical hernia     There are no problems to display for this patient.   History reviewed. No pertinent surgical history.   OB History    Gravida  3   Para  3   Term  2   Preterm  1   AB  0   Living  3     SAB  0   TAB  0   Ectopic  0   Multiple  0   Live Births  3        Obstetric Comments  Hx Preterm delivery per patient (36 weeks)- preeclampsia in first pregnancy All vaginal deliveries        No family history on file.  Social History   Tobacco Use  . Smoking status: Never Smoker  . Smokeless tobacco: Never Used  Substance Use Topics  . Alcohol use: No  . Drug use: No    Home Medications Prior to Admission medications   Not on File    Allergies    Patient has no known allergies.  Review of Systems   Review of Systems  Gastrointestinal: Positive for abdominal pain.  All other systems reviewed and are negative.   Physical Exam Updated Vital Signs BP 132/82 (BP Location: Left Arm)   Pulse 72   Temp 99.1 F (37.3 C) (Oral)    Resp 20   Ht 5' (1.524 m)   Wt 70.3 kg   SpO2 100%   BMI 30.27 kg/m   Physical Exam Vitals and nursing note reviewed.  Constitutional:      General: She is not in acute distress.    Appearance: She is well-developed. She is not diaphoretic.  HENT:     Head: Normocephalic and atraumatic.  Cardiovascular:     Rate and Rhythm: Normal rate and regular rhythm.     Heart sounds: No murmur. No friction rub. No gallop.   Pulmonary:     Effort: Pulmonary effort is normal. No respiratory distress.     Breath sounds: Normal breath sounds. No wheezing.  Abdominal:     General: Bowel sounds are normal. There is no distension.     Palpations: Abdomen is soft.     Tenderness: There is abdominal tenderness in the periumbilical area.     Comments: There is a palpable golf ball sized mass just superior to the umbilicus.  This is tender to palpation and nonreducible.  Musculoskeletal:        General: Normal range of motion.  Cervical back: Normal range of motion and neck supple.  Skin:    General: Skin is warm and dry.  Neurological:     Mental Status: She is alert and oriented to person, place, and time.     ED Results / Procedures / Treatments   Labs (all labs ordered are listed, but only abnormal results are displayed) Labs Reviewed  BASIC METABOLIC PANEL  CBC WITH DIFFERENTIAL/PLATELET  HCG, SERUM, QUALITATIVE    EKG None  Radiology No results found.  Procedures Procedures (including critical care time)  Medications Ordered in ED Medications  sodium chloride 0.9 % bolus 1,000 mL (has no administration in time range)    ED Course  I have reviewed the triage vital signs and the nursing notes.  Pertinent labs & imaging results that were available during my care of the patient were reviewed by me and considered in my medical decision making (see chart for details).    MDM Rules/Calculators/A&P  Patient presenting here with complaints of pain in the area of her  umbilical hernia.  It has become more tender and painful over the past 24 hours.  There is a palpable hernia on exam which is nonreducible.  CT scan obtained shows a fat-containing umbilical hernia but no bowel involvement.  Patient is not vomiting, has no fever, and no white count.  Findings discussed with Dr. Marcello Moores from general surgery.  Patient will be discharged with pain medication and outpatient follow-up with general surgery.  Final Clinical Impression(s) / ED Diagnoses Final diagnoses:  None    Rx / DC Orders ED Discharge Orders    None       Veryl Speak, MD 05/12/19 [phone]

## 2019-05-12 NOTE — Discharge Instructions (Signed)
Take ibuprofen 600 mg every 6 hours as needed for pain.  Take hydrocodone as prescribed as needed for pain not relieved with ibuprofen.  Follow-up with general surgery in the next week.  The contact information for [facility] surgery has been provided in this discharge summary for you to call and make these arrangements.

## 2020-12-14 IMAGING — CT CT ABD-PELV W/ CM
2 of 4 series · 15 of 46 positions shown, 17 images · IV contrast (Omnipaque)
Comparison: July 12, 2016

CLINICAL DATA: Bowel obstruction. Umbilical hernia x1 day.

EXAM:
CT ABDOMEN AND PELVIS WITH CONTRAST
TECHNIQUE: Multidetector CT imaging of the abdomen and pelvis was performed
using the standard protocol following bolus administration of
intravenous contrast.
CONTRAST:  100mL OMNIPAQUE IOHEXOL 300 MG/ML  SOLN

[Series 2: axial st · axial · 0.86mm/px · z∈[+50,+456]mm · 12 of 89 slices shown, 14 images]
[im 4/89  soft-tissue]
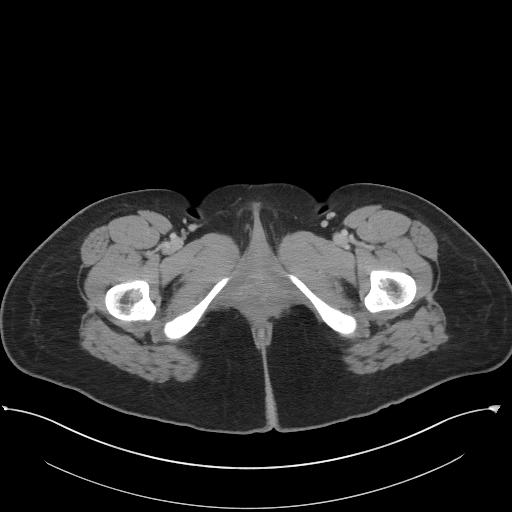
[im 4/89  bone]
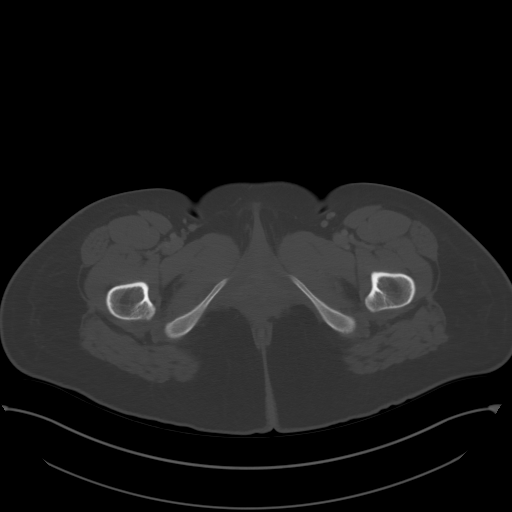
[im 12/89  soft-tissue]
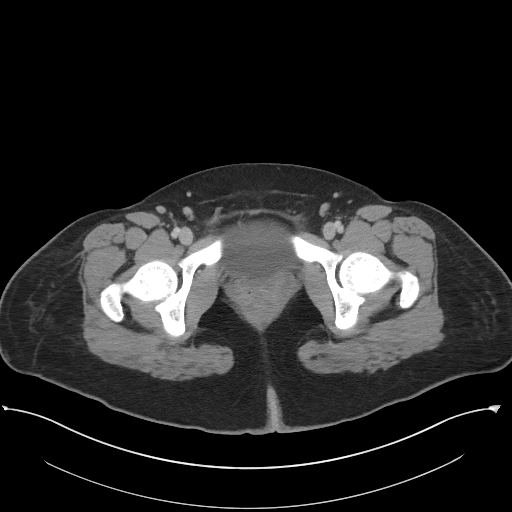
[im 20/89  soft-tissue]
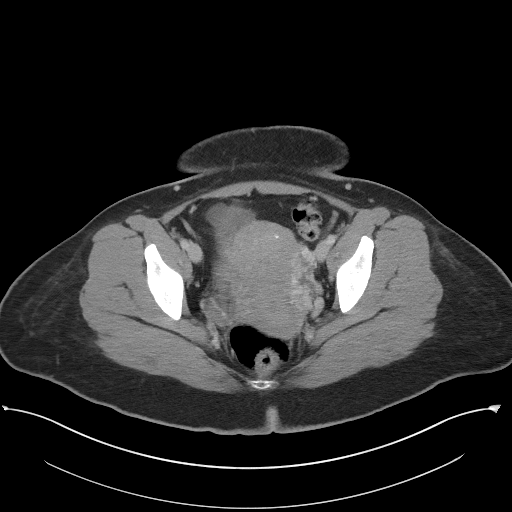
[im 27/89  soft-tissue]
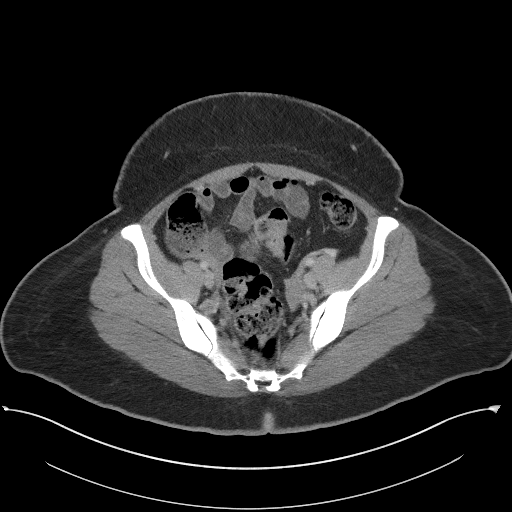
[im 35/89  soft-tissue]
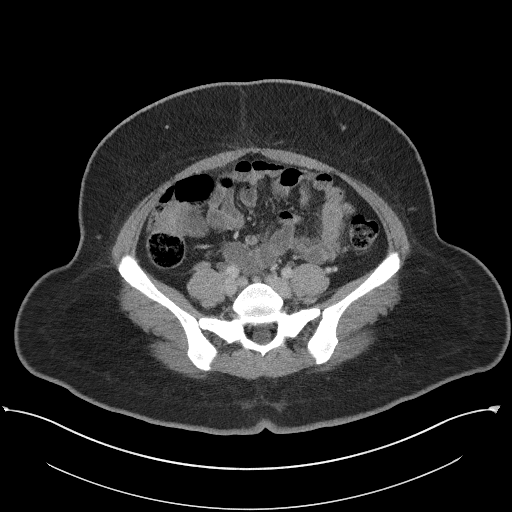
[im 43/89  soft-tissue]
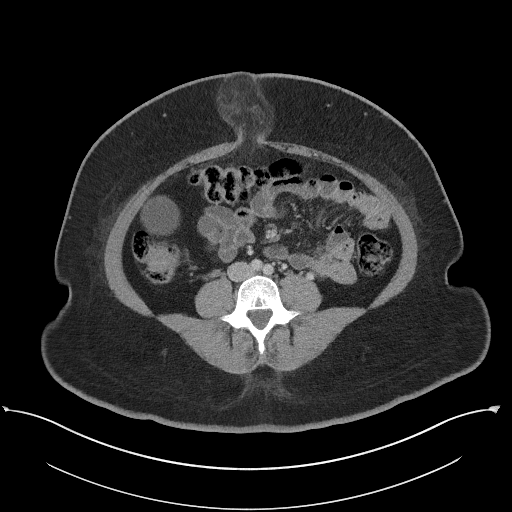
[im 46/89  soft-tissue]
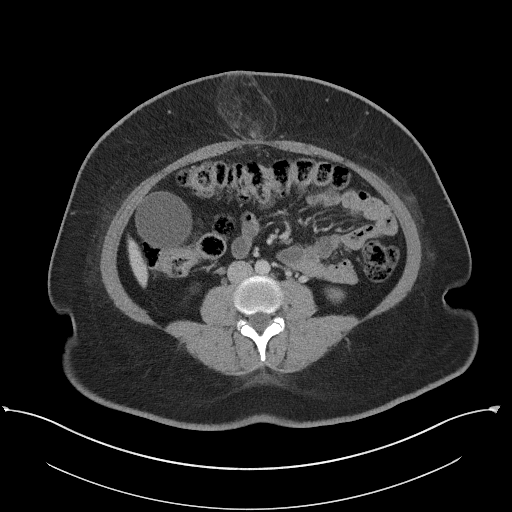
[im 54/89  soft-tissue]
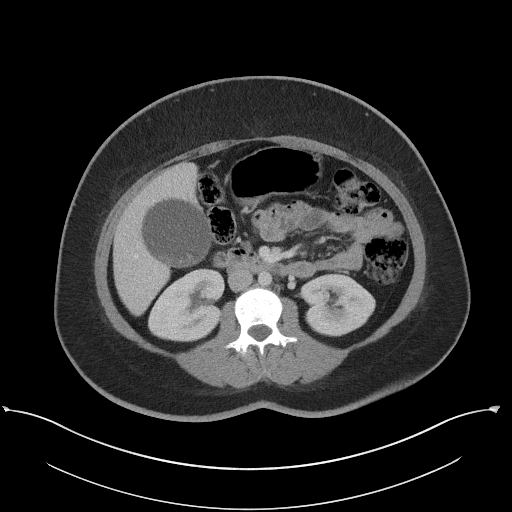
[im 62/89  soft-tissue]
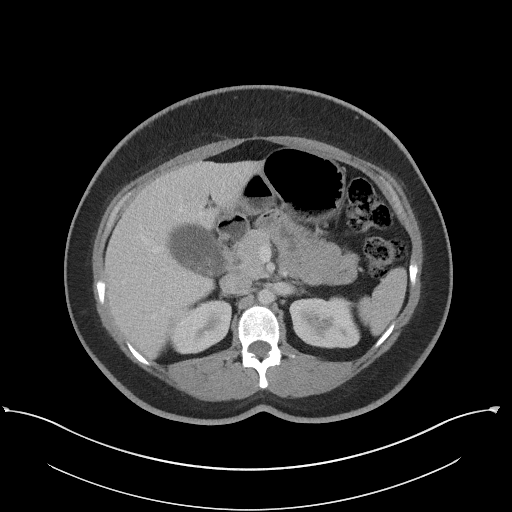
[im 62/89  bone]
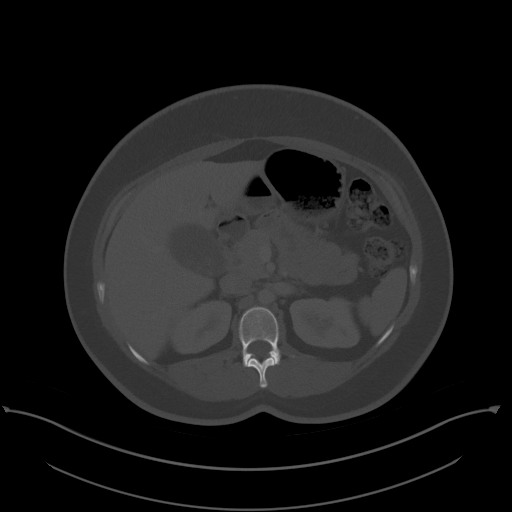
[im 69/89  soft-tissue]
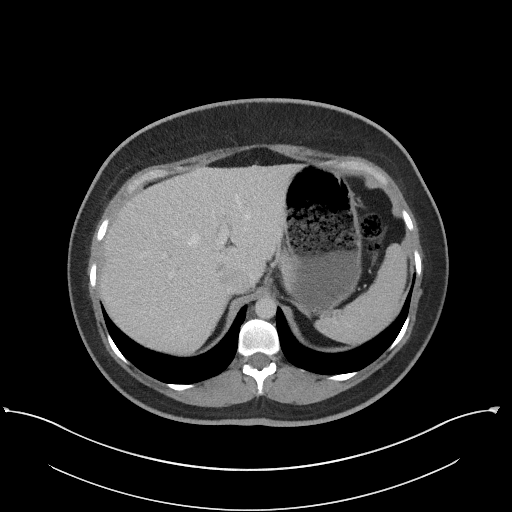
[im 77/89  soft-tissue]
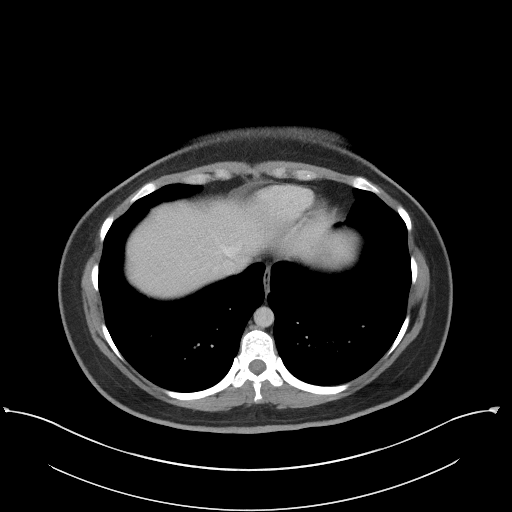
[im 85/89  soft-tissue]
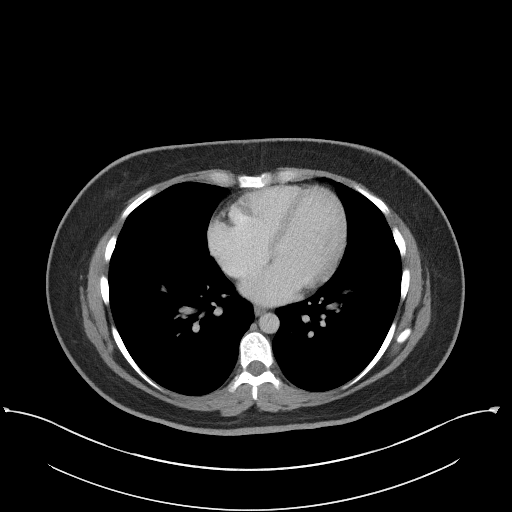

[Series 5: coronal st · coronal · 0.79mm/px · 3 of 99 slices shown]
[im 33/99  soft-tissue]
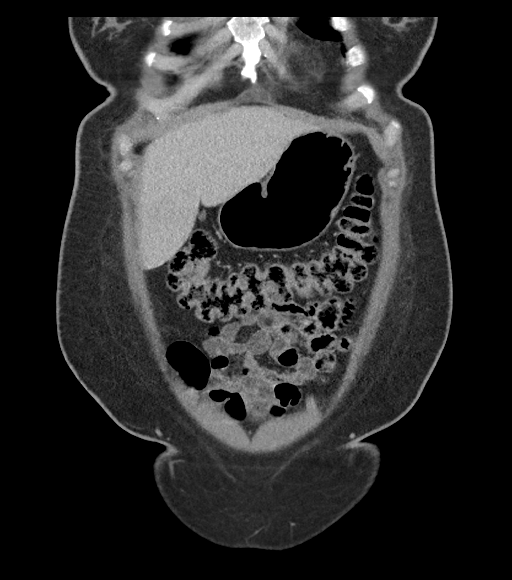
[im 44/99  soft-tissue]
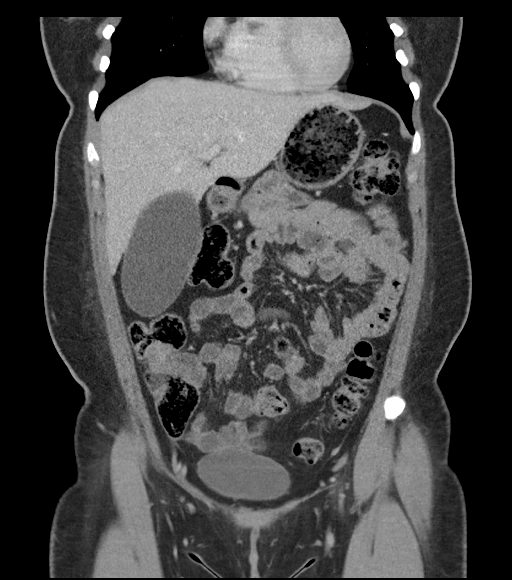
[im 55/99  soft-tissue]
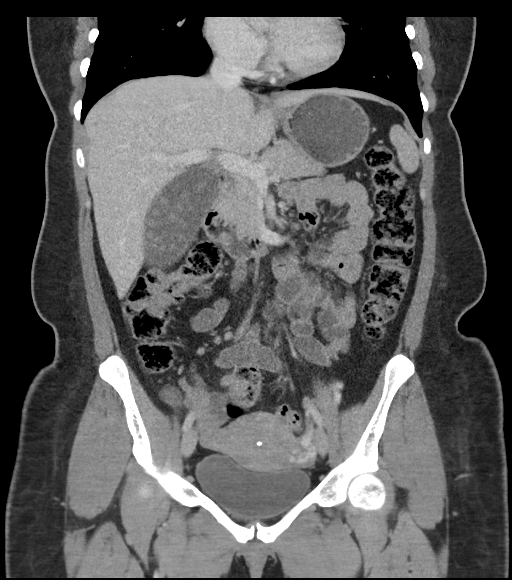

[15 of 46 positions shown; findings below may reference images not displayed]

FINDINGS: Lower chest: The lung bases are clear. The heart size is normal.

Hepatobiliary: The liver is normal. Cholelithiasis without acute
inflammation. The gallbladder is distended without definite evidence
for acute cholecystitis.There is no biliary ductal dilation.

Pancreas: Normal contours without ductal dilatation. No
peripancreatic fluid collection.

Spleen: No splenic laceration or hematoma.

Adrenals/Urinary Tract:

--Adrenal glands: No adrenal hemorrhage.

--Right kidney/ureter: No hydronephrosis or perinephric hematoma.

--Left kidney/ureter: No hydronephrosis or perinephric hematoma.

--Urinary bladder: Unremarkable.

Stomach/Bowel:

--Stomach/Duodenum: No hiatal hernia or other gastric abnormality.
Normal duodenal course and caliber.

--Small bowel: No dilatation or inflammation.

--Colon: No focal abnormality.

--Appendix: The proximal appendix measures up to approximately 8 mm.
There are no definite CT findings to suggest underlying acute
appendicitis.

Vascular/Lymphatic: Normal course and caliber of the major abdominal
vessels.

--No retroperitoneal lymphadenopathy.

--No mesenteric lymphadenopathy.

--No pelvic or inguinal lymphadenopathy.

Reproductive: There is an IUD in place. There are prominent pelvic
veins in the left hemipelvis. The left ovarian vein is prominent.

Other: No ascites or free air. There is a fat containing umbilical
hernia which has significantly increased in size from prior study.
The hernia neck measures approximately 1 cm in diameter. The hernia
sac measures approximately 5.8 by 4.8 cm. There is fat stranding of
the herniated fat.

Musculoskeletal. No acute displaced fractures.
IMPRESSION: 1. Interval increase in size of the fat containing umbilical hernia
with fat stranding of the herniated fat. Findings could represent
incarceration or strangulation in the appropriate clinical setting.
2. The proximal appendix measures up to 8 mm in diameter. There are
no additional findings to suggest acute appendicitis, however
correlation with physical exam and laboratory studies is
recommended.
3. There are prominent pelvic veins in the left hemipelvis. The left
ovarian vein is prominent. Correlate with clinical symptoms of
pelvic congestion syndrome.
4. Cholelithiasis without evidence of acute cholecystitis. The
gallbladder is distended. If there is clinical concern for a right
upper quadrant process, follow-up with ultrasound is recommended.
